# Patient Record
Sex: Male | Born: 1995 | Race: Black or African American | Hispanic: No | Marital: Single | Smoking: Never smoker
Health system: Southern US, Community
[De-identification: ages and names within clinical notes are randomized; demographics above are authoritative.]

---

## 2020-06-15 ENCOUNTER — Other Ambulatory Visit: Payer: Self-pay

## 2020-06-15 ENCOUNTER — Emergency Department (HOSPITAL_COMMUNITY)
Admission: EM | Admit: 2020-06-15 | Discharge: 2020-06-16 | Disposition: A | Discharge status: Home / Self Care | Payer: Self-pay | Attending: Emergency Medicine | Admitting: Emergency Medicine

## 2020-06-15 ENCOUNTER — Emergency Department (HOSPITAL_COMMUNITY): Payer: Self-pay

## 2020-06-15 ENCOUNTER — Encounter (HOSPITAL_COMMUNITY): Payer: Self-pay | Admitting: *Deleted

## 2020-06-15 DIAGNOSIS — R0981 Nasal congestion: Secondary | ICD-10-CM | POA: Insufficient documentation

## 2020-06-15 DIAGNOSIS — R05 Cough: Secondary | ICD-10-CM | POA: Insufficient documentation

## 2020-06-15 DIAGNOSIS — R112 Nausea with vomiting, unspecified: Secondary | ICD-10-CM | POA: Insufficient documentation

## 2020-06-15 DIAGNOSIS — Z20822 Contact with and (suspected) exposure to covid-19: Secondary | ICD-10-CM | POA: Insufficient documentation

## 2020-06-15 DIAGNOSIS — R059 Cough, unspecified: Secondary | ICD-10-CM

## 2020-06-15 DIAGNOSIS — R062 Wheezing: Secondary | ICD-10-CM | POA: Insufficient documentation

## 2020-06-15 LAB — URINALYSIS, ROUTINE W REFLEX MICROSCOPIC
Bilirubin Urine: NEGATIVE
Glucose, UA: NEGATIVE mg/dL
Hgb urine dipstick: NEGATIVE
Ketones, ur: NEGATIVE mg/dL
Leukocytes,Ua: NEGATIVE
Nitrite: NEGATIVE
Protein, ur: NEGATIVE mg/dL
Specific Gravity, Urine: 1.024 (ref 1.005–1.030)
pH: 6 (ref 5.0–8.0)

## 2020-06-15 LAB — COMPREHENSIVE METABOLIC PANEL
ALT: 27 U/L (ref 0–44)
AST: 22 U/L (ref 15–41)
Albumin: 4.6 g/dL (ref 3.5–5.0)
Alkaline Phosphatase: 62 U/L (ref 38–126)
Anion gap: 10 (ref 5–15)
BUN: 11 mg/dL (ref 6–20)
CO2: 26 mmol/L (ref 22–32)
Calcium: 9.1 mg/dL (ref 8.9–10.3)
Chloride: 102 mmol/L (ref 98–111)
Creatinine, Ser: 1.25 mg/dL — ABNORMAL HIGH (ref 0.61–1.24)
GFR calc Af Amer: >60 mL/min (ref >60)
GFR calc non Af Amer: >60 mL/min (ref >60)
Glucose, Bld: 96 mg/dL (ref 70–99)
Potassium: 4.1 mmol/L (ref 3.5–5.1)
Sodium: 138 mmol/L (ref 135–145)
Total Bilirubin: 0.9 mg/dL (ref 0.3–1.2)
Total Protein: 8.4 g/dL — ABNORMAL HIGH (ref 6.5–8.1)

## 2020-06-15 LAB — CBC
HCT: 48 % (ref 39.0–52.0)
Hemoglobin: 15.3 g/dL (ref 13.0–17.0)
MCH: 27.8 pg (ref 26.0–34.0)
MCHC: 31.9 g/dL (ref 30.0–36.0)
MCV: 87.1 fL (ref 80.0–100.0)
Platelets: 193 10*3/uL (ref 150–400)
RBC: 5.51 MIL/uL (ref 4.22–5.81)
RDW: 13 % (ref 11.5–15.5)
WBC: 6 10*3/uL (ref 4.0–10.5)
nRBC: 0 % (ref 0.0–0.2)

## 2020-06-15 LAB — LIPASE, BLOOD: Lipase: 26 U/L (ref 11–51)

## 2020-06-15 MED ORDER — ONDANSETRON 4 MG PO TBDP: 4.0000 mg | ORAL_TABLET | Freq: Three times a day (TID) | ORAL | 0 refills | Status: AC | PRN

## 2020-06-15 MED ORDER — ALBUTEROL SULFATE HFA 108 (90 BASE) MCG/ACT IN AERS
2.0000 | INHALATION_SPRAY | Freq: Once | RESPIRATORY_TRACT | Status: AC
  Administered 2020-06-15: 2 via RESPIRATORY_TRACT
  Filled 2020-06-15: qty 6.7

## 2020-06-15 MED ORDER — PREDNISONE 10 MG (21) PO TBPK: ORAL_TABLET | ORAL | 0 refills | Status: AC

## 2020-06-15 MED ORDER — FLUTICASONE PROPIONATE 50 MCG/ACT NA SUSP: 2.0000 | Freq: Every day | NASAL | 2 refills | Status: AC

## 2020-06-15 MED ORDER — PREDNISONE 20 MG PO TABS
60.0000 mg | ORAL_TABLET | Freq: Once | ORAL | Status: AC
  Administered 2020-06-15: 60 mg via ORAL
  Filled 2020-06-15: qty 3

## 2020-06-15 NOTE — ED Provider Notes (Signed)
[facility]-EMERGENCY DEPT Provider Note   CSN: [id number] Arrival date & time: 06/15/20  1505     History Chief Complaint  Patient presents with  . Vomiting  . Cough  . Nasal Congestion    Eugene Alexander is a 24 y.o. Alexander.  HPI      Eugene Alexander is a 24 y.o. Alexander, patient with no diagnosed past medical history, presenting to the ED with nasal congestion, sneezing, body aches, and sore throat from postnasal drip beginning Monday, September 13. Patient also developed cough, fever, intermittent shortness of breath, nausea, and some episodes of vomiting.  Eugene Alexander does state many of the episodes of vomiting occurred with coughing fits.  Denies syncope, chest pain without coughing, abdominal pain without coughing, diarrhea, or any other complaints.    History reviewed. No pertinent past medical history.  There are no problems to display for this patient.   History reviewed. No pertinent surgical history.     No family history on file.  Social History   Tobacco Use  . Smoking status: Never Smoker  . Smokeless tobacco: Never Used  Substance Use Topics  . Alcohol use: Yes  . Drug use: Never    Home Medications Prior to Admission medications   Medication Sig Start Date End Date Taking? Authorizing Provider  fluticasone (FLONASE) 50 MCG/ACT nasal spray Place 2 sprays into both nostrils daily. 06/15/20   Kerryann Allaire C, PA-C  ondansetron (ZOFRAN ODT) 4 MG disintegrating tablet Take 1 tablet (4 mg total) by mouth every 8 (eight) hours as needed for nausea or vomiting. 06/15/20   Melchizedek Espinola C, PA-C  predniSONE (STERAPRED UNI-PAK 21 TAB) 10 MG (21) TBPK tablet Take 6 tabs (60mg ) day 1, 5 tabs (50mg ) day 2, 4 tabs (40mg ) day 3, 3 tabs (30mg ) day 4, 2 tabs (20mg ) day 5, and 1 tab (10mg ) day 6. 06/15/20   Guido Comp C, PA-C    Allergies    Patient has no known allergies.  Review of Systems   Review of Systems  Constitutional: Positive for fever.    Respiratory: Positive for cough and shortness of breath.   Cardiovascular: Negative for chest pain and leg swelling.  Gastrointestinal: Positive for nausea and vomiting. Negative for abdominal pain and diarrhea.  Neurological: Negative for dizziness and syncope.  All other systems reviewed and are negative.   Physical Exam Updated Vital Signs BP (!) 145/84 (BP Location: Left Arm)   Pulse 79   Temp 98.8 F (37.1 C) (Oral)   Resp 20   Ht 6\' 10"  (2.083 m)   Wt 122.5 kg   SpO2 99%   BMI 28.23 kg/m   Physical Exam Vitals and nursing note reviewed.  Constitutional:      General: Eugene Alexander is not in acute distress.    Appearance: Eugene Alexander is well-developed. Eugene Alexander is not diaphoretic.  HENT:     Head: Normocephalic and atraumatic.     Nose: Congestion and rhinorrhea present.     Mouth/Throat:     Mouth: Mucous membranes are moist.     Pharynx: Oropharynx is clear.  Eyes:     Conjunctiva/sclera: Conjunctivae normal.  Cardiovascular:     Rate and Rhythm: Normal rate and regular rhythm.     Pulses: Normal pulses.          Radial pulses are 2+ on the right side and 2+ on the left side.       Posterior tibial pulses are 2+ on the right side and 2+  on the left side.     Heart sounds: Normal heart sounds.     Comments: Tactile temperature in the extremities appropriate and equal bilaterally. Pulmonary:     Effort: Pulmonary effort is normal. No respiratory distress.     Breath sounds: Wheezing present.     Comments: No increased work of breathing.  Speaks in full sentences without difficulty. Abdominal:     Palpations: Abdomen is soft.     Tenderness: There is no abdominal tenderness. There is no guarding.  Musculoskeletal:     Cervical back: Neck supple.     Right lower leg: No edema.     Left lower leg: No edema.  Lymphadenopathy:     Cervical: No cervical adenopathy.  Skin:    General: Skin is warm and dry.  Neurological:     Mental Status: Eugene Alexander is alert.  Psychiatric:        Mood and  Affect: Mood and affect normal.        Speech: Speech normal.        Behavior: Behavior normal.     ED Results / Procedures / Treatments   Labs (all labs ordered are listed, but only abnormal results are displayed) Labs Reviewed  COMPREHENSIVE METABOLIC PANEL - Abnormal; Notable for the following components:      Result Value   Creatinine, Ser 1.25 (*)    Total Protein 8.4 (*)    All other components within normal limits  SARS CORONAVIRUS 2 BY RT PCR (HOSPITAL ORDER, PERFORMED IN [facility])  LIPASE, BLOOD  CBC  URINALYSIS, ROUTINE W REFLEX MICROSCOPIC    EKG None  Radiology DG Chest 2 View  Result Date: 06/15/2020 CLINICAL DATA:  Cough. EXAM: CHEST - 2 VIEW COMPARISON:  None. FINDINGS: The heart size and mediastinal contours are within normal limits. Both lungs are clear. The visualized skeletal structures are unremarkable. IMPRESSION: No active cardiopulmonary disease. Electronically Signed   By: Gerome Sam III M.D   On: 06/15/2020 16:53    Procedures Procedures (including critical care time)  Medications Ordered in ED Medications  albuterol (VENTOLIN HFA) 108 (90 Base) MCG/ACT inhaler 2 puff (2 puffs Inhalation Given 06/15/20 2352)  predniSONE (DELTASONE) tablet 60 mg (60 mg Oral Given 06/15/20 2352)    ED Course  I have reviewed the triage vital signs and the nursing notes.  Pertinent labs & imaging results that were available during my care of the patient were reviewed by me and considered in my medical decision making (see chart for details).    MDM Rules/Calculators/A&P                          Patient presents with symptoms of sneezing, nasal congestion, cough, fever, intermittent shortness of breath, nausea, and vomiting. Patient is nontoxic appearing, afebrile, not tachycardic, not tachypneic, not hypotensive, maintains excellent SPO2 on room air, and is in no apparent distress.   I reviewed and interpreted the patient's labs and  radiological studies. Creatinine at 1.25 without previous values with which to compare. Lab work otherwise unremarkable. Chest x-ray without acute abnormality. Covid test pending at time of discharge.  The patient was given instructions for home care as well as return precautions. Patient voices understanding of these instructions, accepts the plan, and is comfortable with discharge.     Vitals:   06/15/20 1558 06/15/20 1603 06/15/20 1953  BP: (!) 155/82  (!) 145/84  Pulse: 69  79  Resp:  17  20  Temp: 98.8 F (37.1 C)    TempSrc: Oral    SpO2: 98%  99%  Weight:  122.5 kg   Height:  6\' 10"  (2.083 m)     Eugene Alexander was evaluated in Emergency Department on 06/16/2020 for the symptoms described in the history of present illness. Eugene Alexander was evaluated in the context of the global COVID-19 pandemic, which necessitated consideration that the patient might be at risk for infection with the SARS-CoV-2 virus that causes COVID-19. Institutional protocols and algorithms that pertain to the evaluation of patients at risk for COVID-19 are in a state of rapid change based on information released by regulatory bodies including the CDC and federal and state organizations. These policies and algorithms were followed during the patient's care in the ED.  Final Clinical Impression(s) / ED Diagnoses Final diagnoses:  Nasal congestion  Cough  Non-intractable vomiting with nausea, unspecified vomiting type    Rx / DC Orders ED Discharge Orders         Ordered    predniSONE (STERAPRED UNI-PAK 21 TAB) 10 MG (21) TBPK tablet        06/15/20 2356    ondansetron (ZOFRAN ODT) 4 MG disintegrating tablet  Every 8 hours PRN        06/15/20 2356    fluticasone (FLONASE) 50 MCG/ACT nasal spray  Daily        06/15/20 2356           06/17/20, PA-C 06/16/20 0016    [place], 06/18/20, MD 06/16/20 [phone]

## 2020-06-15 NOTE — Discharge Instructions (Addendum)
General Viral Syndrome Care Instructions:  Your symptoms are likely consistent with a viral illness. Viruses do not require or respond to antibiotics. Treatment is symptomatic care and it is important to note that these symptoms may last for 7-14 days.   Hand washing: Wash your hands throughout the day, but especially before and after touching the face, using the restroom, sneezing, coughing, or touching surfaces that have been coughed or sneezed upon. Hydration: Symptoms of most illnesses will be intensified and complicated by dehydration. Dehydration can also extend the duration of symptoms. Drink plenty of fluids and get plenty of rest. You should be drinking at least half a liter of water an hour to stay hydrated. Electrolyte drinks (ex. Gatorade, Powerade, Pedialyte) are also encouraged. You should be drinking enough fluids to make your urine light yellow, almost clear. If this is not the case, you are not drinking enough water. Please note that some of the treatments indicated below will not be effective if you are not adequately hydrated. Diet: Please concentrate on hydration, however, you may introduce food slowly.  Start with a clear liquid diet, progressed to a full liquid diet, and then bland solids as you are able. Pain or fever: Ibuprofen, Naproxen, or acetaminophen (generic for Tylenol) for pain or fever.  Antiinflammatory medications: Take 600 mg of ibuprofen every 6 hours or 440 mg (over the counter dose) to 500 mg (prescription dose) of naproxen every 12 hours for the next 3 days. After this time, these medications may be used as needed for pain. Take these medications with food to avoid upset stomach. Choose only one of these medications, do not take them together. Acetaminophen (generic for Tylenol): Should you continue to have additional pain while taking the ibuprofen or naproxen, you may add in acetaminophen as needed. Your daily total maximum amount of acetaminophen from all sources  should be limited to 4000mg /day for persons without liver problems, or 2000mg /day for those with liver problems. Nausea/vomiting: Use the ondansetron (generic for Zofran) for nausea or vomiting.  This medication may not prevent all vomiting or nausea, but can help facilitate better hydration. Things that can help with nausea/vomiting also include peppermint/menthol candies, vitamin B12, and ginger. Cough: Teas, warm liquids, broths, and honey can help with cough. Albuterol: May use the albuterol as needed for instances of shortness of breath.  Administer 2 puffs every 4-6 hours, as needed, for shortness of breath or wheezing. Prednisone: Take the prednisone, as directed, in its entirety. Zyrtec or Claritin: May add these medication daily to control underlying symptoms of congestion, sneezing, and other signs of allergies.  These medications are available over-the-counter. Generics: Cetirizine (generic for Zyrtec) and loratadine (generic for Claritin). Fluticasone: Use fluticasone (generic for Flonase), as directed, for nasal and sinus congestion.  This medication is available over-the-counter. Congestion: Plain guaifenesin (generic for plain Mucinex) may help relieve congestion. Saline sinus rinses and saline nasal sprays may also help relieve congestion. If you do not have high blood pressure, heart problems, or an allergy to such medications, you may also try phenylephrine or Sudafed. Sore throat: Warm liquids or Chloraseptic spray may help soothe a sore throat. Gargle twice a day with a salt water solution made from a half teaspoon of salt in a cup of warm water.  Follow up: Follow up with a primary care provider within the next two weeks should symptoms fail to resolve. Return: Return to the ED for significantly worsening symptoms, shortness of breath, persistent vomiting, large amounts of blood in  stool, or any other major concerns.  For prescription assistance, may try using prescription discount  sites or apps, such as goodrx.com  Test Results for COVID-19 pending  You have a test pending for COVID-19.  Results typically return within about 48 hours.  Be sure to check MyChart for updated results.  We recommend isolating yourself until results are received.  Patients who have symptoms consistent with COVID-19 should self isolated for: At least 3 days (72 hours) have passed since recovery, defined as resolution of fever without the use of fever reducing medications and improvement in respiratory symptoms (e.g., cough, shortness of breath), and At least 7 days have passed since symptoms first appeared.  If you have no symptoms, but your test returns positive, recommend isolating for at least 10 days.

## 2020-06-15 NOTE — ED Triage Notes (Signed)
Pt states he has been vomiting, nasal congestion, cough, greenish sputum with cough. Headache, chest discomfort with cough

## 2020-06-16 LAB — SARS CORONAVIRUS 2 BY RT PCR (HOSPITAL ORDER, PERFORMED IN ~~LOC~~ HOSPITAL LAB): SARS Coronavirus 2: NEGATIVE

## 2021-03-24 IMAGING — CR DG CHEST 2V
2 series · 2 of 2 positions shown · non-contrast
Comparison: None.

CLINICAL DATA: Cough.

EXAM:
CHEST - 2 VIEW

[w chest pa]
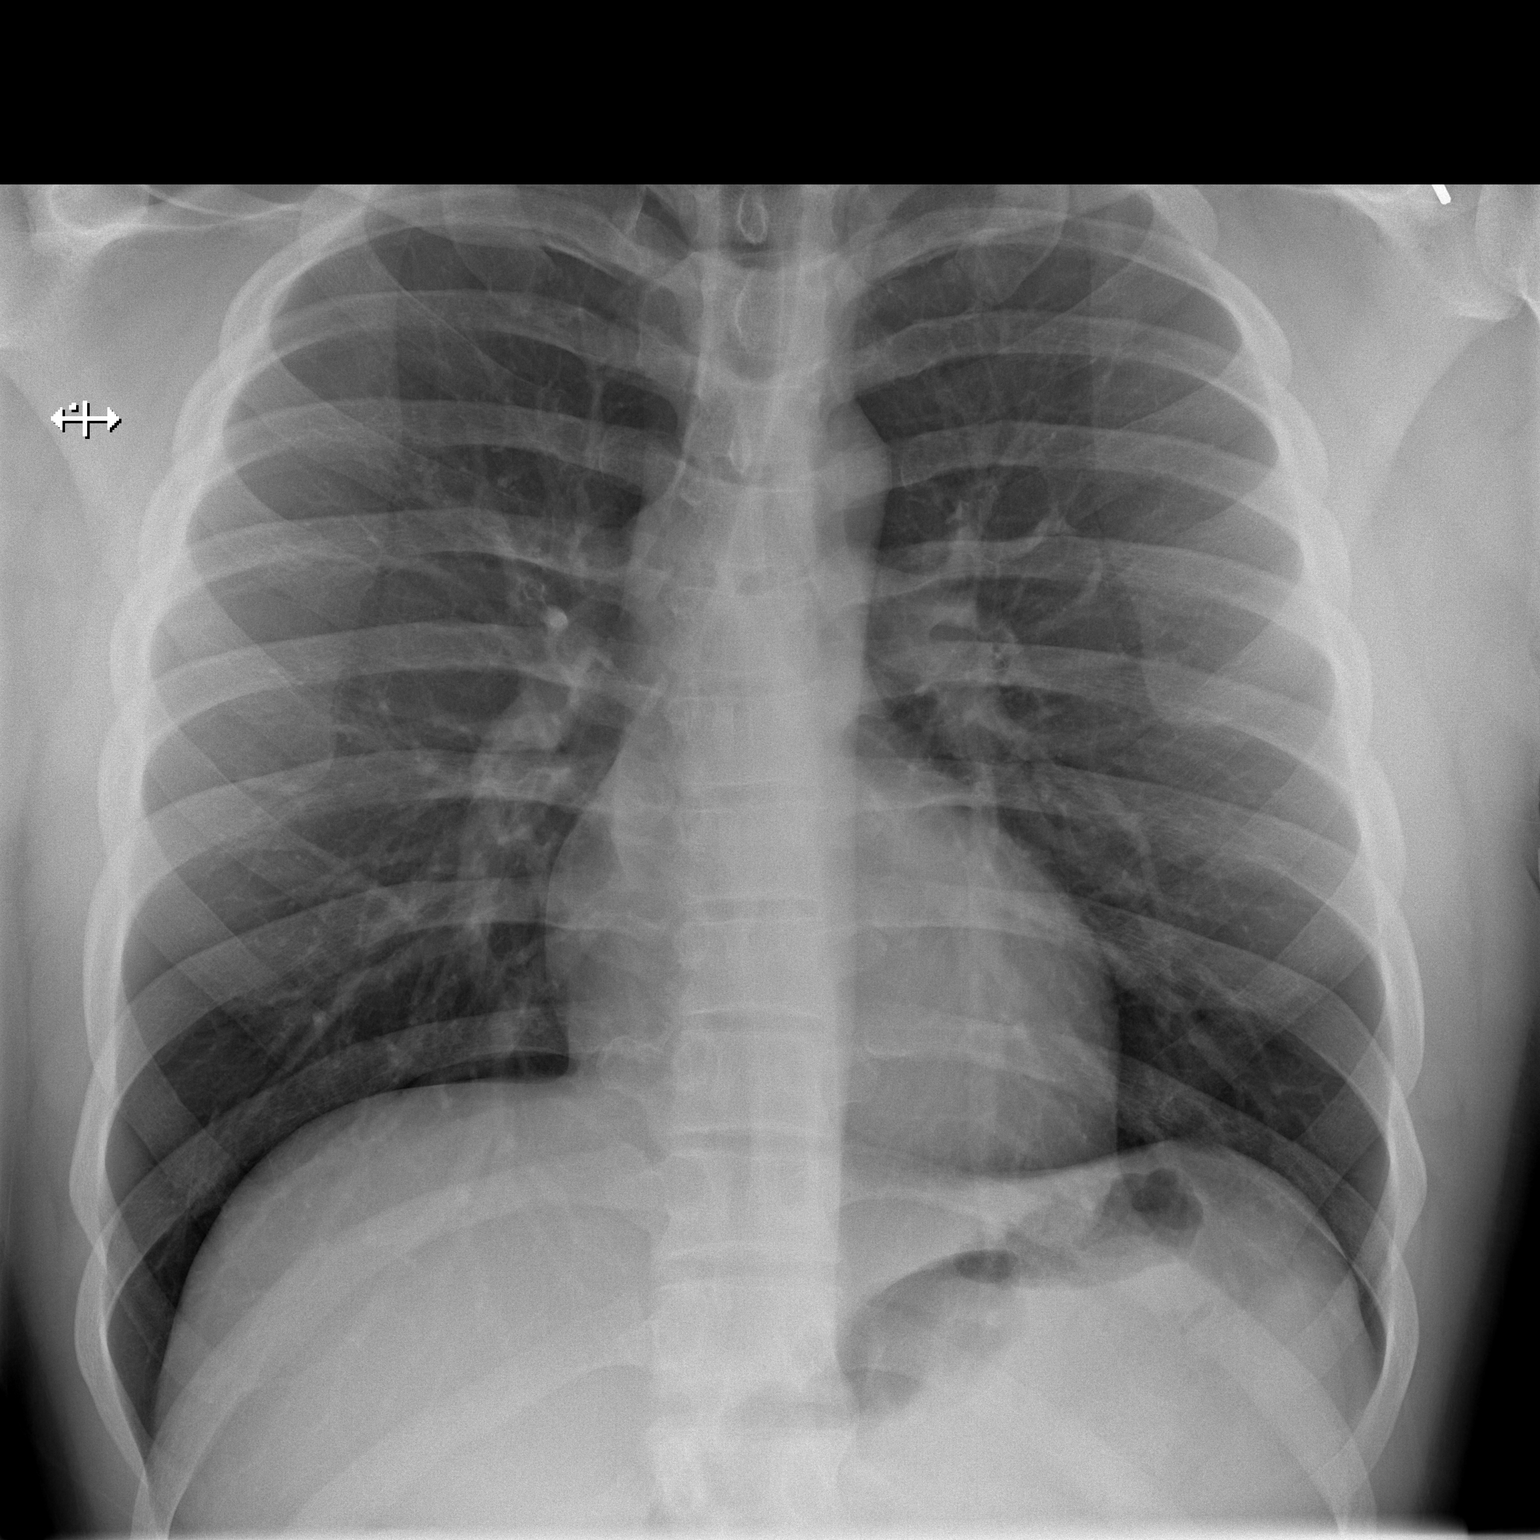

[w chest lat]
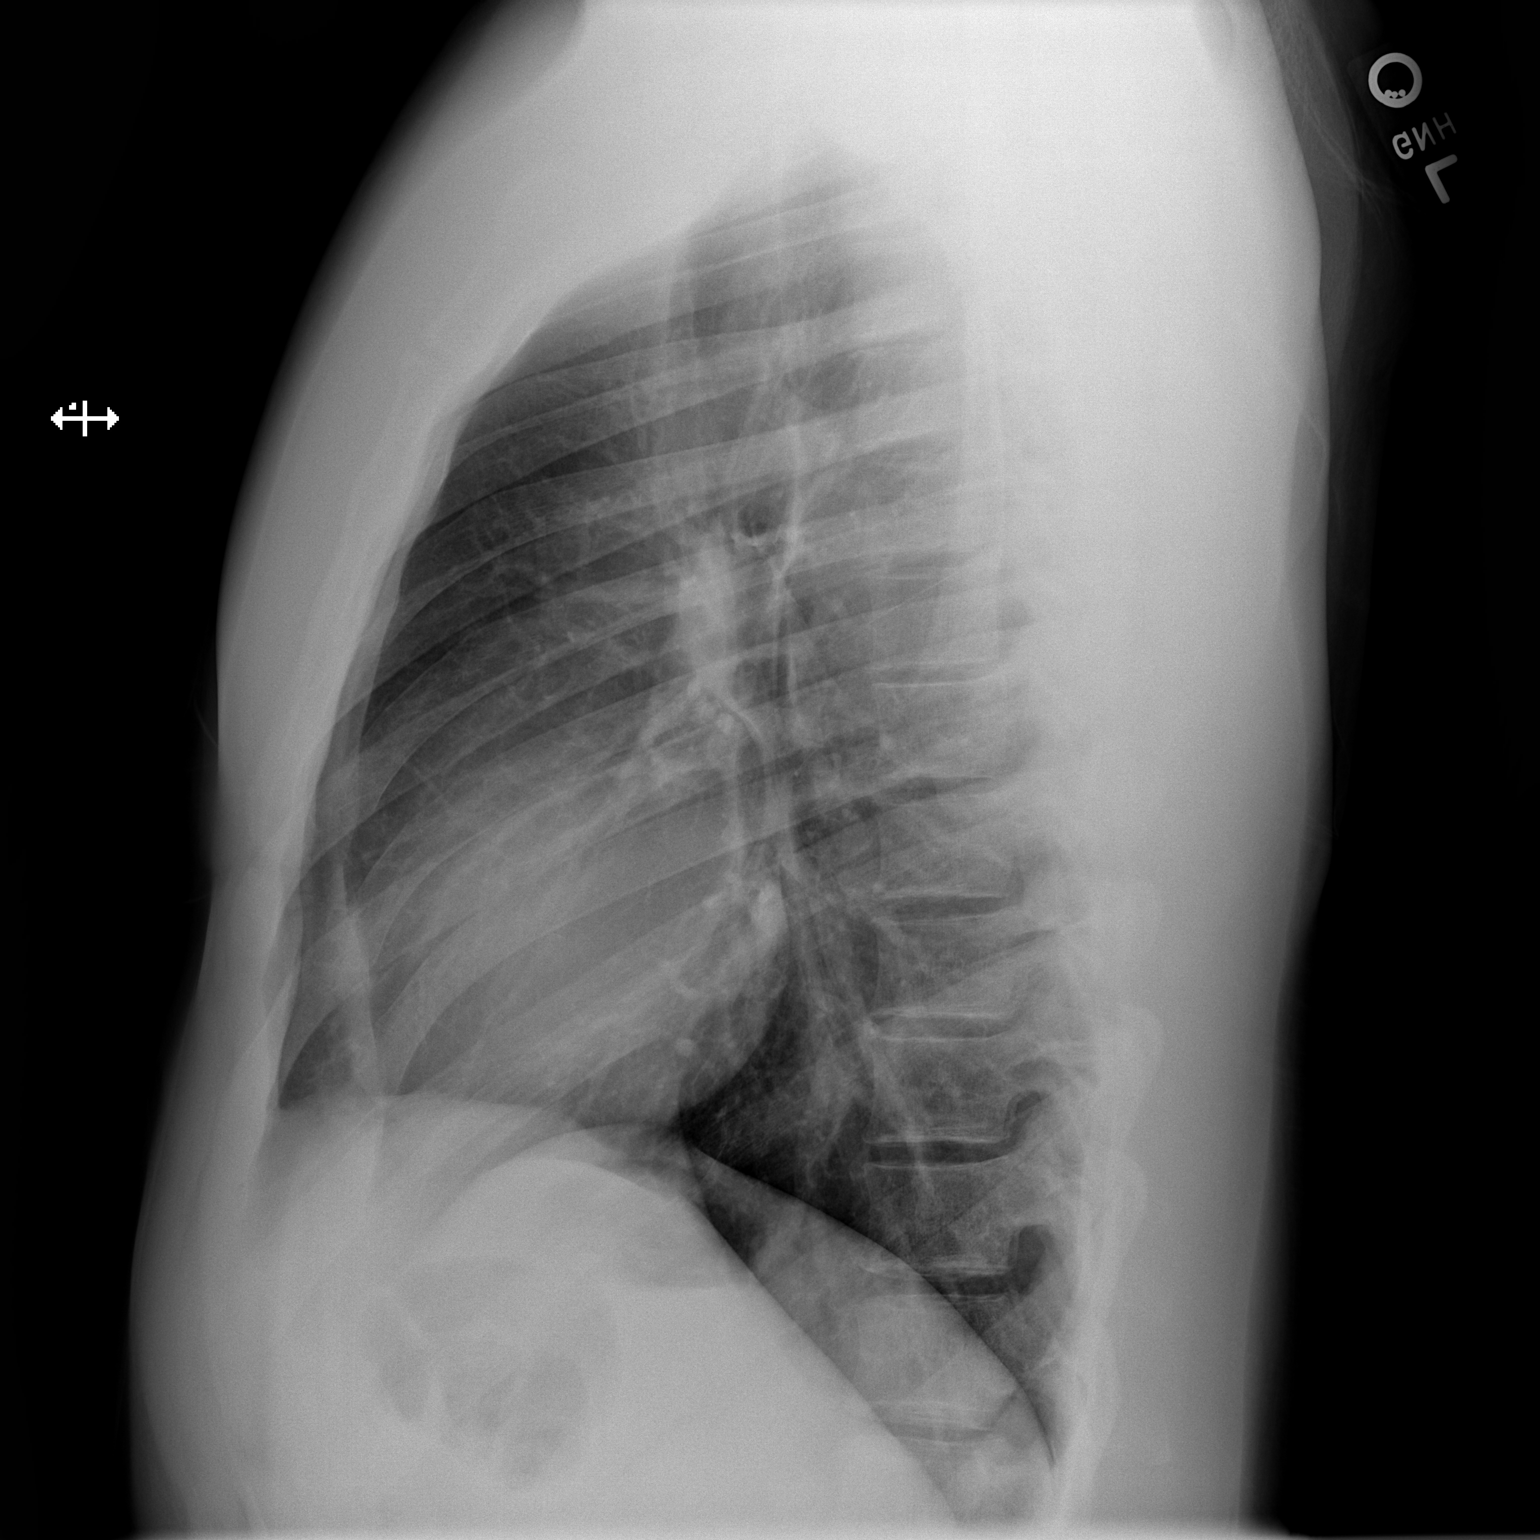

[2 of 2 positions shown; findings below may reference images not displayed]

FINDINGS: The heart size and mediastinal contours are within normal limits.
Both lungs are clear. The visualized skeletal structures are
unremarkable.
IMPRESSION: No active cardiopulmonary disease.
